# Patient Record
Sex: Male | Born: 1975 | Race: White | Hispanic: No | Marital: Single | Smoking: Former smoker
Health system: Southern US, Community
[De-identification: ages and names within clinical notes are randomized; demographics above are authoritative.]

## PROBLEM LIST (undated history)

## (undated) DIAGNOSIS — N411 Chronic prostatitis: Secondary | ICD-10-CM

## (undated) DIAGNOSIS — R202 Paresthesia of skin: Secondary | ICD-10-CM

## (undated) DIAGNOSIS — R200 Anesthesia of skin: Secondary | ICD-10-CM

## (undated) DIAGNOSIS — F419 Anxiety disorder, unspecified: Secondary | ICD-10-CM

## (undated) DIAGNOSIS — I10 Essential (primary) hypertension: Secondary | ICD-10-CM

## (undated) HISTORY — DX: Anxiety disorder, unspecified: F41.9

## (undated) HISTORY — DX: Paresthesia of skin: R20.0

## (undated) HISTORY — DX: Paresthesia of skin: R20.2

## (undated) HISTORY — PX: WISDOM TOOTH EXTRACTION: SHX21

## (undated) HISTORY — DX: Essential (primary) hypertension: I10

## (undated) HISTORY — DX: Chronic prostatitis: N41.1

---

## 2014-10-29 ENCOUNTER — Encounter: Payer: Self-pay | Admitting: Neurology

## 2014-10-29 ENCOUNTER — Ambulatory Visit (INDEPENDENT_AMBULATORY_CARE_PROVIDER_SITE_OTHER): Payer: 59 | Admitting: Neurology

## 2014-10-29 ENCOUNTER — Ambulatory Visit: Payer: 59 | Admitting: Neurology

## 2014-10-29 VITALS — BP 107/76 | HR 71 | Ht 68.25 in | Wt 183.0 lb

## 2014-10-29 DIAGNOSIS — R202 Paresthesia of skin: Secondary | ICD-10-CM

## 2014-10-29 DIAGNOSIS — F419 Anxiety disorder, unspecified: Secondary | ICD-10-CM | POA: Diagnosis not present

## 2014-10-29 NOTE — Progress Notes (Signed)
PATIENT: Roger Herring DOB: 30-Sep-1975  Chief Complaint  Patient presents with  . Numbness    He has been having numbness/tingling in his left leg, 2nd, 3rd and 4th toes on his left foot.  Occasionally, he will have these symptoms in his bilateral hands.  Feels these sensations started after having a reaction to Cipro he was taking for chronic prostatits.     HISTORICAL  Roger Herring is a 39 years old right-handed male, seen in refer by his primary care physician Dr. Lanora Manis Barns for evaluation of intermittent bilateral lower extremity and bilateral hands paresthesia     I have reviewed and summarized most recent office note in October 18 2014    He had a past medical history of hypertension, depression anxiety, recurrent chronic prostatitis, Laboratory evaluation in August 2016, normal CBC, CMP, negative HIV, RPR,  Negative hepatitis B, hepatitis C antibody, mild elevated cholesterol 221, LDL 148  Since July 2016, he had recurrent prostatitis, was treated with few rounds of antibiotics, Bactrim, Cipro, doxycycline  Around and of July 2016, he noticed bilateral lower extremity paresthesia, from knee down, burning stinging sensation, involving both legs symmetrically, over the past few weeks, his lower extremity paresthesia has gradually improved,  He was diagnosed with left foot tendinitis, wearing left ankle brace for a while , his bilateral lower extremity paresthesia almost disappeared, with exception of persistent left second toe to 4th toe numbness, he denies gait difficulty, he denies weakness, he denies bowel and bladder incontinence, he denies low back pain He also complains of intermittent fingertips paresthesia, no persistent sensory loss   REVIEW OF SYSTEMS: Full 14 system review of systems performed and notable only for  Weight loss, fatigue, snoring, urination problem, flushing, achy muscles, allergy, skin sensitivity, numbness, insomnia, snoring, depression, anxiety,  not enough sleep, change in appetite, disinteresting activities, racing thoughts  ALLERGIES: Allergies  Allergen Reactions  . Fluocinolone Dermatitis  . Sulfamethoxazole-Trimethoprim Dermatitis    HOME MEDICATIONS: Current Outpatient Prescriptions  Medication Sig Dispense Refill  . ALPRAZolam (XANAX) 0.25 MG tablet TAKE 1 TABLET BY MOUTH TWICE A DAY AS NEEDED FOR ANXIETY  0  . cefUROXime (CEFTIN) 500 MG tablet Take 500 mg by mouth 2 (two) times daily.  0  . lisinopril (PRINIVIL,ZESTRIL) 10 MG tablet Take 10 mg by mouth daily.  0  . Probiotic Product (SOLUBLE FIBER/PROBIOTICS PO) Take by mouth.    . saw palmetto 160 MG capsule Take 160 mg by mouth 2 (two) times daily.     No current facility-administered medications for this visit.    PAST MEDICAL HISTORY: Past Medical History  Diagnosis Date  . Chronic prostatitis   . Hypertension   . Anxiety   . Numbness and tingling     PAST SURGICAL HISTORY: Past Surgical History  Procedure Laterality Date  . Wisdom tooth extraction      FAMILY HISTORY: Family History  Problem Relation Age of Onset  . Hypertension Father   . Hypertension Mother   . Anxiety disorder Father   . Breast cancer Maternal Grandmother   . Prostatitis Maternal Grandfather     SOCIAL HISTORY:  Social History   Social History  . Marital Status: Single    Spouse Name: N/A  . Number of Children: 0  . Years of Education: 16   Occupational History  . [employer] sales    Social History Main Topics  . Smoking status: Former Games developer  . Smokeless tobacco: Not on file  Comment: Quit 2014 - only smoked socially  . Alcohol Use: No     Comment: None used since June 2016.  . Drug Use: No  . Sexual Activity: Not on file   Other Topics Concern  . Not on file   Social History Narrative   Lives at home with his partner.   Right-handed.   No caffeine use.     PHYSICAL EXAM   Filed Vitals:   10/29/14 1346  BP: 107/76  Pulse: 71  Height:  5' 8.25" (1.734 m)  Weight: 183 lb (83.008 kg)    Not recorded      Body mass index is 27.61 kg/(m^2).  PHYSICAL EXAMNIATION:  Gen: NAD, conversant, well nourised, obese, well groomed                     Cardiovascular: Regular rate rhythm, no peripheral edema, warm, nontender. Eyes: Conjunctivae clear without exudates or hemorrhage Neck: Supple, no carotid bruise. Pulmonary: Clear to auscultation bilaterally   NEUROLOGICAL EXAM:  MENTAL STATUS: Speech:    Speech is normal; fluent and spontaneous with normal comprehension.  Cognition:     Orientation to time, place and person     Normal recent and remote memory     Normal Attention span and concentration     Normal Language, naming, repeating,spontaneous speech     Fund of knowledge   CRANIAL NERVES: CN II: Visual fields are full to confrontation. Fundoscopic exam is normal with sharp discs and no vascular changes. Pupils are round equal and briskly reactive to light. CN III, IV, VI: extraocular movement are normal. No ptosis. CN V: Facial sensation is intact to pinprick in all 3 divisions bilaterally. Corneal responses are intact.  CN VII: Face is symmetric with normal eye closure and smile. CN VIII: Hearing is normal to rubbing fingers CN IX, X: Palate elevates symmetrically. Phonation is normal. CN XI: Head turning and shoulder shrug are intact CN XII: Tongue is midline with normal movements and no atrophy.  MOTOR: There is no pronator drift of out-stretched arms. Muscle bulk and tone are normal. Muscle strength is normal.  REFLEXES: Reflexes are 2+ and symmetric at the biceps, triceps, knees, and ankles. Plantar responses are flexor.  SENSORY: Intact to light touch, pinprick, position sense, and vibration sense are intact in fingers and toes.  COORDINATION: Rapid alternating movements and fine finger movements are intact. There is no dysmetria on finger-to-nose and heel-knee-shin.    GAIT/STANCE: Posture is  normal. Gait is steady with normal steps, base, arm swing, and turning. Heel and toe walking are normal. Tandem gait is normal.  Romberg is absent.   DIAGNOSTIC DATA (LABS, IMAGING, TESTING) - I reviewed patient records, labs, notes, testing and imaging myself where available.   ASSESSMENT AND PLAN  Addiel Mccardle is a 39 y.o. male   Paresthesia   differentiation diagnosis including  transient compression neuropathy, involves left common peroneal nerve, bilateral carpal tunnel syndromes.   His symptoms overall has much improved    After discuss with patient, we decided to continue observe his symptoms   Return to clinic for new issues   Levert Feinstein, M.D. Ph.D.  [facility] [address] [place], [place] [address] Ph: [phone] Fax: [phone]  CC: Dr Juluis Rainier

## 2017-11-16 DIAGNOSIS — E7800 Pure hypercholesterolemia, unspecified: Secondary | ICD-10-CM | POA: Diagnosis not present

## 2017-11-16 DIAGNOSIS — I10 Essential (primary) hypertension: Secondary | ICD-10-CM | POA: Diagnosis not present

## 2017-11-16 DIAGNOSIS — Z0000 Encounter for general adult medical examination without abnormal findings: Secondary | ICD-10-CM | POA: Diagnosis not present

## 2017-11-16 DIAGNOSIS — J302 Other seasonal allergic rhinitis: Secondary | ICD-10-CM | POA: Diagnosis not present

## 2017-12-13 DIAGNOSIS — R197 Diarrhea, unspecified: Secondary | ICD-10-CM | POA: Diagnosis not present

## 2018-03-27 ENCOUNTER — Emergency Department (HOSPITAL_COMMUNITY): Payer: BLUE CROSS/BLUE SHIELD

## 2018-03-27 ENCOUNTER — Emergency Department (HOSPITAL_COMMUNITY)
Admission: EM | Admit: 2018-03-27 | Discharge: 2018-03-27 | Disposition: A | Discharge status: Home / Self Care | Payer: BLUE CROSS/BLUE SHIELD | Attending: Emergency Medicine | Admitting: Emergency Medicine

## 2018-03-27 ENCOUNTER — Encounter (HOSPITAL_COMMUNITY): Payer: Self-pay | Admitting: Emergency Medicine

## 2018-03-27 DIAGNOSIS — R0789 Other chest pain: Secondary | ICD-10-CM

## 2018-03-27 DIAGNOSIS — Z79899 Other long term (current) drug therapy: Secondary | ICD-10-CM | POA: Diagnosis not present

## 2018-03-27 DIAGNOSIS — Z87891 Personal history of nicotine dependence: Secondary | ICD-10-CM | POA: Insufficient documentation

## 2018-03-27 DIAGNOSIS — I10 Essential (primary) hypertension: Secondary | ICD-10-CM | POA: Diagnosis not present

## 2018-03-27 DIAGNOSIS — R0602 Shortness of breath: Secondary | ICD-10-CM | POA: Diagnosis not present

## 2018-03-27 DIAGNOSIS — R079 Chest pain, unspecified: Secondary | ICD-10-CM | POA: Diagnosis not present

## 2018-03-27 LAB — CBC WITH DIFFERENTIAL/PLATELET
Abs Immature Granulocytes: 0.02 10*3/uL (ref 0.00–0.07)
Basophils Absolute: 0 10*3/uL (ref 0.0–0.1)
Basophils Relative: 1 %
EOS PCT: 2 %
Eosinophils Absolute: 0.2 10*3/uL (ref 0.0–0.5)
HCT: 50.1 % (ref 39.0–52.0)
Hemoglobin: 16.1 g/dL (ref 13.0–17.0)
Immature Granulocytes: 0 %
Lymphocytes Relative: 33 %
Lymphs Abs: 2.2 10*3/uL (ref 0.7–4.0)
MCH: 28.6 pg (ref 26.0–34.0)
MCHC: 32.1 g/dL (ref 30.0–36.0)
MCV: 89 fL (ref 80.0–100.0)
MONO ABS: 0.5 10*3/uL (ref 0.1–1.0)
Monocytes Relative: 7 %
Neutro Abs: 3.8 10*3/uL (ref 1.7–7.7)
Neutrophils Relative %: 57 %
Platelets: 220 10*3/uL (ref 150–400)
RBC: 5.63 MIL/uL (ref 4.22–5.81)
RDW: 13.1 % (ref 11.5–15.5)
WBC: 6.7 10*3/uL (ref 4.0–10.5)
nRBC: 0 % (ref 0.0–0.2)

## 2018-03-27 LAB — COMPREHENSIVE METABOLIC PANEL
ALT: 43 U/L (ref 0–44)
AST: 29 U/L (ref 15–41)
Albumin: 4.3 g/dL (ref 3.5–5.0)
Alkaline Phosphatase: 47 U/L (ref 38–126)
Anion gap: 10 (ref 5–15)
BUN: 11 mg/dL (ref 6–20)
CHLORIDE: 103 mmol/L (ref 98–111)
CO2: 25 mmol/L (ref 22–32)
Calcium: 9.6 mg/dL (ref 8.9–10.3)
Creatinine, Ser: 1.14 mg/dL (ref 0.61–1.24)
GFR calc Af Amer: >60 mL/min (ref >60)
GFR calc non Af Amer: >60 mL/min (ref >60)
Glucose, Bld: 109 mg/dL — ABNORMAL HIGH (ref 70–99)
Potassium: 3.9 mmol/L (ref 3.5–5.1)
Sodium: 138 mmol/L (ref 135–145)
Total Bilirubin: 1.3 mg/dL — ABNORMAL HIGH (ref 0.3–1.2)
Total Protein: 7.6 g/dL (ref 6.5–8.1)

## 2018-03-27 LAB — I-STAT TROPONIN, ED: Troponin i, poc: 0 ng/mL (ref 0.00–0.08)

## 2018-03-27 LAB — LIPASE, BLOOD: Lipase: 31 U/L (ref 11–51)

## 2018-03-27 MED ORDER — HYDROXYZINE HCL 25 MG PO TABS: 25.0000 mg | ORAL_TABLET | Freq: Four times a day (QID) | ORAL | 0 refills | Status: AC

## 2018-03-27 MED ORDER — ALPRAZOLAM 0.25 MG PO TABS
0.2500 mg | ORAL_TABLET | Freq: Once | ORAL | Status: AC
  Administered 2018-03-27: 0.25 mg via ORAL
  Filled 2018-03-27: qty 1

## 2018-03-27 NOTE — ED Provider Notes (Signed)
[facility] Note   CSN: [id number] Arrival date & time: 03/27/18  1725     History   Chief Complaint Chief Complaint  Patient presents with  . Chest Pain    HPI Roger Herring is a 43 y.o. male with a past medical history of hypertension, anxiety, who presents today for evaluation of chest tightness.  He reports that at around 4 AM he awoke with approximately 1 hour of chest pain.  He says that that went away.  He says that he ate dinner and the chest pain came back.  He took some Pepto-Bismol and felt better however continues to have 3 out of 10 tightness in his chest.  He denies nausea vomiting or diarrhea.  He denies any shortness of breath.  He notes that he is very anxious.  He denies diaphoresis.  He denies any recent surgeries, immobilizations, leg swelling, hemoptysis, or hormone use.    At approximately 5 PM he took a second 10 mg of lisinopril as he was concerned that his pain may be due to his blood pressure being elevated.    HPI  Past Medical History:  Diagnosis Date  . Anxiety   . Chronic prostatitis   . Hypertension   . Numbness and tingling     There are no active problems to display for this patient.   Past Surgical History:  Procedure Laterality Date  . WISDOM TOOTH EXTRACTION          Home Medications    Prior to Admission medications   Medication Sig Start Date End Date Taking? Authorizing Provider  ALPRAZolam (XANAX) 0.25 MG tablet TAKE 1 TABLET BY MOUTH TWICE A DAY AS NEEDED FOR ANXIETY 10/18/14   [provider]  cefUROXime (CEFTIN) 500 MG tablet Take 500 mg by mouth 2 (two) times daily. 10/15/14   [provider]  hydrOXYzine (ATARAX/VISTARIL) 25 MG tablet Take 1 tablet (25 mg total) by mouth every 6 (six) hours. 03/27/18   Roger Herring,  W, PA-C  lisinopril (PRINIVIL,ZESTRIL) 10 MG tablet Take 10 mg by mouth daily. 10/18/14   [provider]  Probiotic Product (SOLUBLE  FIBER/PROBIOTICS PO) Take by mouth.    [provider]  saw palmetto 160 MG capsule Take 160 mg by mouth 2 (two) times daily.    [provider]    Family History Family History  Problem Relation Age of Onset  . Hypertension Mother   . Hypertension Father   . Anxiety disorder Father   . Breast cancer Maternal Grandmother   . Prostatitis Maternal Grandfather     Social History Social History   Tobacco Use  . Smoking status: Former Games developermoker  . Smokeless tobacco: Never Used  . Tobacco comment: Quit 2014 - only smoked socially  Substance Use Topics  . Alcohol use: No    Alcohol/week: 0.0 standard drinks    Comment: None used since June 2016.  . Drug use: No     Allergies   Fluocinolone and Sulfamethoxazole-trimethoprim   Review of Systems Review of Systems  Constitutional: Negative for chills and fever.  HENT: Negative for congestion.   Eyes: Negative for visual disturbance.  Respiratory: Positive for chest tightness. Negative for cough and shortness of breath.   Cardiovascular: Positive for chest pain. Negative for palpitations and leg swelling.  Gastrointestinal: Negative for abdominal pain, diarrhea, nausea and vomiting.  Genitourinary: Negative for dysuria.  Musculoskeletal: Negative for back pain and neck pain.  Neurological: Negative for weakness,  numbness and headaches.  All other systems reviewed and are negative.    Physical Exam Updated Vital Signs BP (!) 141/87 (BP Location: Right Arm)   Pulse 91   Temp 98.3 F (36.8 C) (Oral)   Resp 16   SpO2 99%   Physical Exam Vitals signs and nursing note reviewed.  Constitutional:      General: He is not in acute distress.    Appearance: He is well-developed.  HENT:     Head: Normocephalic and atraumatic.  Eyes:     Conjunctiva/sclera: Conjunctivae normal.  Neck:     Musculoskeletal: Normal range of motion and neck supple.     Vascular: No JVD.     Trachea: No tracheal deviation.    Cardiovascular:     Rate and Rhythm: Normal rate and regular rhythm.     Pulses: Normal pulses.     Heart sounds: Normal heart sounds. No murmur.     Comments: Mild TTP over anterior chest, primarily right sided.  Unable to recreate his pain with palpation.  Pulmonary:     Effort: Pulmonary effort is normal. No tachypnea, accessory muscle usage or respiratory distress.     Breath sounds: Normal breath sounds. No stridor. No decreased breath sounds, wheezing or rhonchi.  Chest:     Chest wall: Tenderness present.  Abdominal:     General: Bowel sounds are normal.     Palpations: Abdomen is soft.     Tenderness: There is no abdominal tenderness. There is no guarding or rebound.  Lymphadenopathy:     Cervical: No cervical adenopathy.  Skin:    General: Skin is warm and dry.  Neurological:     General: No focal deficit present.     Mental Status: He is alert. Mental status is at baseline.  Psychiatric:        Mood and Affect: Mood normal.        Behavior: Behavior normal.      ED Treatments / Results  Labs (all labs ordered are listed, but only abnormal results are displayed) Labs Reviewed  COMPREHENSIVE METABOLIC PANEL - Abnormal; Notable for the following components:      Result Value   Glucose, Bld 109 (*)    Total Bilirubin 1.3 (*)    All other components within normal limits  CBC WITH DIFFERENTIAL/PLATELET  LIPASE, BLOOD  I-STAT TROPONIN, ED    EKG EKG Interpretation  Date/Time:  Sunday March 27 2018 17:31:59 EST Ventricular Rate:  112 PR Interval:    QRS Duration: 95 QT Interval:  318 QTC Calculation: 434 R Axis:   25 Text Interpretation:  Sinus tachycardia Borderline T abnormalities, anterior leads no acute ischemic appearance, no old comparison Confirmed by Arby BarrettePfeiffer, Marcy [phone]([id number]) on 03/27/2018 5:38:27 PM   EKG Interpretation  Date/Time:  Sunday March 27 2018 20:19:45 EST Ventricular Rate:  91 PR Interval:    QRS Duration: 108 QT Interval:  350 QTC  Calculation: 431 R Axis:   34 Text Interpretation:  Sinus rhythm Low voltage, precordial leads Borderline T abnormalities, anterior leads no interval change from previous Confirmed by Arby BarrettePfeiffer, Marcy [phone]([id number]) on 03/27/2018 8:24:07 PM        Radiology Dg Chest 2 View  Result Date: 03/27/2018 CLINICAL DATA:  Centralized chest pain.  Shortness of breath. EXAM: CHEST - 2 VIEW COMPARISON:  None. FINDINGS: The heart size and mediastinal contours are within normal limits. Both lungs are clear. The visualized skeletal structures are unremarkable. IMPRESSION: No active cardiopulmonary disease. Electronically Signed  By: Gerome Sam III M.D   On: 03/27/2018 19:14    Procedures Procedures (including critical care time)  Medications Ordered in ED Medications  ALPRAZolam Prudy Feeler) tablet 0.25 mg (0.25 mg Oral Given 03/27/18 1948)     Initial Impression / Assessment and Plan / ED Course  I have reviewed the triage vital signs and the nursing notes.  Pertinent labs & imaging results that were available during my care of the patient were reviewed by me and considered in my medical decision making (see chart for details).  Clinical Course as of Mar 27 2148  Wynelle Link Mar 27, 2018  1952 Patient reevaluated.  He reports feeling generally better however he is still anxious.  Ordered home dose of Xanax.  He does not want to stay for 3-hour repeat troponin, current plan is approximately 45 minutes after p.o. Xanax will repeat EKG, as long as there are no changes will anticipate discharge home.   [EH]    Clinical Course User Index [EH] Roger Gong, PA-C   Patient presents today for evaluation of 2 episodes of chest pain.  His most recent 1 started after he was eating today.  Labs were obtained and reviewed, troponin is not elevated, he does not have any significant electrolyte or hematologic derangements.  Lipase is not elevated.  EKG reviewed with nonspecific anterior borderline T wave changes.  He  was observed in the emergency room for over an hour and EKG was repeated showing no changes.  He did report feeling significantly anxious and was treated with Xanax as he has taken this at home in the past and it has helped.  His pain went away while he was in the emergency room.  I discussed with him a second troponin, which he declined.  Will he has intermittently been tachycardic in the emergency room, when he is distracted his heart rate normalizes, this is more consistent with anxiety than PE, and he is otherwise PERC negative.  Heart score is 2 if count borderline T wave abnormalities as repolarization changes.    With his anxiety will give prescription for hydroxyzine to use at home if needed.  He did take a second dose of lisinopril prior to arrival, however his blood pressure has consistently been in the 140s systolic.  Advised to follow-up with primary care doctor regarding today's ED visit.  Return precautions were discussed with patient who states their understanding.  At the time of discharge patient denied any unaddressed complaints or concerns.  Patient is agreeable for discharge home.   Final Clinical Impressions(s) / ED Diagnoses   Final diagnoses:  Atypical chest pain  Hypertension, unspecified type    ED Discharge Orders         Ordered    hydrOXYzine (ATARAX/VISTARIL) 25 MG tablet  Every 6 hours     03/27/18 1944           Norman Clay 03/27/18 2150    Arby Barrette, MD 03/28/18 [phone]

## 2018-03-27 NOTE — ED Triage Notes (Signed)
Patient reports chest tightness that started overnight. He normally takes Lisinopril 10mg , but he took another 10mg  because he thought it might be his blood pressure. He reports that the chest tightness came back after he ate, he took some Pepto and felt better but continues to feel tightness in the center of his chest.

## 2018-03-27 NOTE — Discharge Instructions (Addendum)
Today you were given a dose of Xanax while in the department to help with your anxiety.  You have been given one prescription, hydroxyzine also known as Atarax or Vistaril which you can take as needed for anxiety.  I have given you 3 handouts to read.  Please make sure you read through them.  Mindfulness based stress reduction, nonspecific chest pain, and hypertension.  While you are in the emergency room your blood pressure was elevated, please schedule a follow-up appointment with your doctor in the next week, or if you have any new concerning or worsening symptoms please seek additional medical care and evaluation.  Today you received medications that may make you sleepy or impair your ability to make decisions.  For the next 24 hours please do not drive, operate heavy machinery, care for a small child with out another adult present, or perform any activities that may cause harm to you or someone else if you were to fall asleep or be impaired.   You are being prescribed a medication which may make you sleepy.  Please use caution doing any of the above tasks while taking it, and I recommend that you do not drive or perform other dangerous activities until you know how this medicine will affect you.

## 2018-04-12 DIAGNOSIS — B349 Viral infection, unspecified: Secondary | ICD-10-CM | POA: Diagnosis not present

## 2018-04-12 DIAGNOSIS — I10 Essential (primary) hypertension: Secondary | ICD-10-CM | POA: Diagnosis not present

## 2018-11-29 DIAGNOSIS — E7800 Pure hypercholesterolemia, unspecified: Secondary | ICD-10-CM | POA: Diagnosis not present

## 2018-11-29 DIAGNOSIS — I10 Essential (primary) hypertension: Secondary | ICD-10-CM | POA: Diagnosis not present

## 2018-11-29 DIAGNOSIS — J302 Other seasonal allergic rhinitis: Secondary | ICD-10-CM | POA: Diagnosis not present

## 2018-12-01 DIAGNOSIS — Z23 Encounter for immunization: Secondary | ICD-10-CM | POA: Diagnosis not present

## 2018-12-01 DIAGNOSIS — Z6830 Body mass index (BMI) 30.0-30.9, adult: Secondary | ICD-10-CM | POA: Diagnosis not present

## 2018-12-01 DIAGNOSIS — I10 Essential (primary) hypertension: Secondary | ICD-10-CM | POA: Diagnosis not present

## 2018-12-01 DIAGNOSIS — E7800 Pure hypercholesterolemia, unspecified: Secondary | ICD-10-CM | POA: Diagnosis not present

## 2019-04-20 ENCOUNTER — Ambulatory Visit: Payer: Managed Care, Other (non HMO) | Attending: Internal Medicine

## 2019-04-20 DIAGNOSIS — Z23 Encounter for immunization: Secondary | ICD-10-CM | POA: Insufficient documentation

## 2019-04-20 NOTE — Progress Notes (Signed)
   Covid-19 Vaccination Clinic  Name:  Roger Herring    MRN: [id number] DOB: [birth year]  04/20/2019  [name] was observed post Covid-19 immunization for 15 minutes without incidence. He was provided with Vaccine Information Sheet and instruction to access the V-Safe system.   [name] was instructed to call 911 with any severe reactions post vaccine: Marland Kitchen Difficulty breathing  . Swelling of your face and throat  . A fast heartbeat  . A bad rash all over your body  . Dizziness and weakness    Immunizations Administered    Name Date Dose VIS Date Route   Pfizer COVID-19 Vaccine 04/20/2019 10:57 AM 0.3 mL 02/17/2019 Intramuscular   Manufacturer: [employer], [employer]   Lot: [id number]   NDC: [address]-8

## 2019-05-13 ENCOUNTER — Ambulatory Visit: Payer: Managed Care, Other (non HMO) | Attending: Internal Medicine

## 2019-05-13 DIAGNOSIS — Z23 Encounter for immunization: Secondary | ICD-10-CM | POA: Insufficient documentation

## 2019-05-13 NOTE — Progress Notes (Signed)
   Covid-19 Vaccination Clinic  Name:  Clayton Bosserman    MRN: [id number] DOB: [birth year]  05/13/2019  Mr. Suppes was observed post Covid-19 immunization for 15 minutes without incident. He was provided with Vaccine Information Sheet and instruction to access the V-Safe system.   Mr. Axel was instructed to call 911 with any severe reactions post vaccine: Marland Kitchen Difficulty breathing  . Swelling of face and throat  . A fast heartbeat  . A bad rash all over body  . Dizziness and weakness   Immunizations Administered    Name Date Dose VIS Date Route   Pfizer COVID-19 Vaccine 05/13/2019 11:00 AM 0.3 mL 02/17/2019 Intramuscular   Manufacturer: [employer], [employer]   Lot: [id number]   NDC: [address]-1

## 2020-02-19 IMAGING — CR DG CHEST 2V
2 series · 2 of 2 positions shown · non-contrast
Comparison: None.

CLINICAL DATA: Centralized chest pain.  Shortness of breath.

EXAM:
CHEST - 2 VIEW

[chest pa]
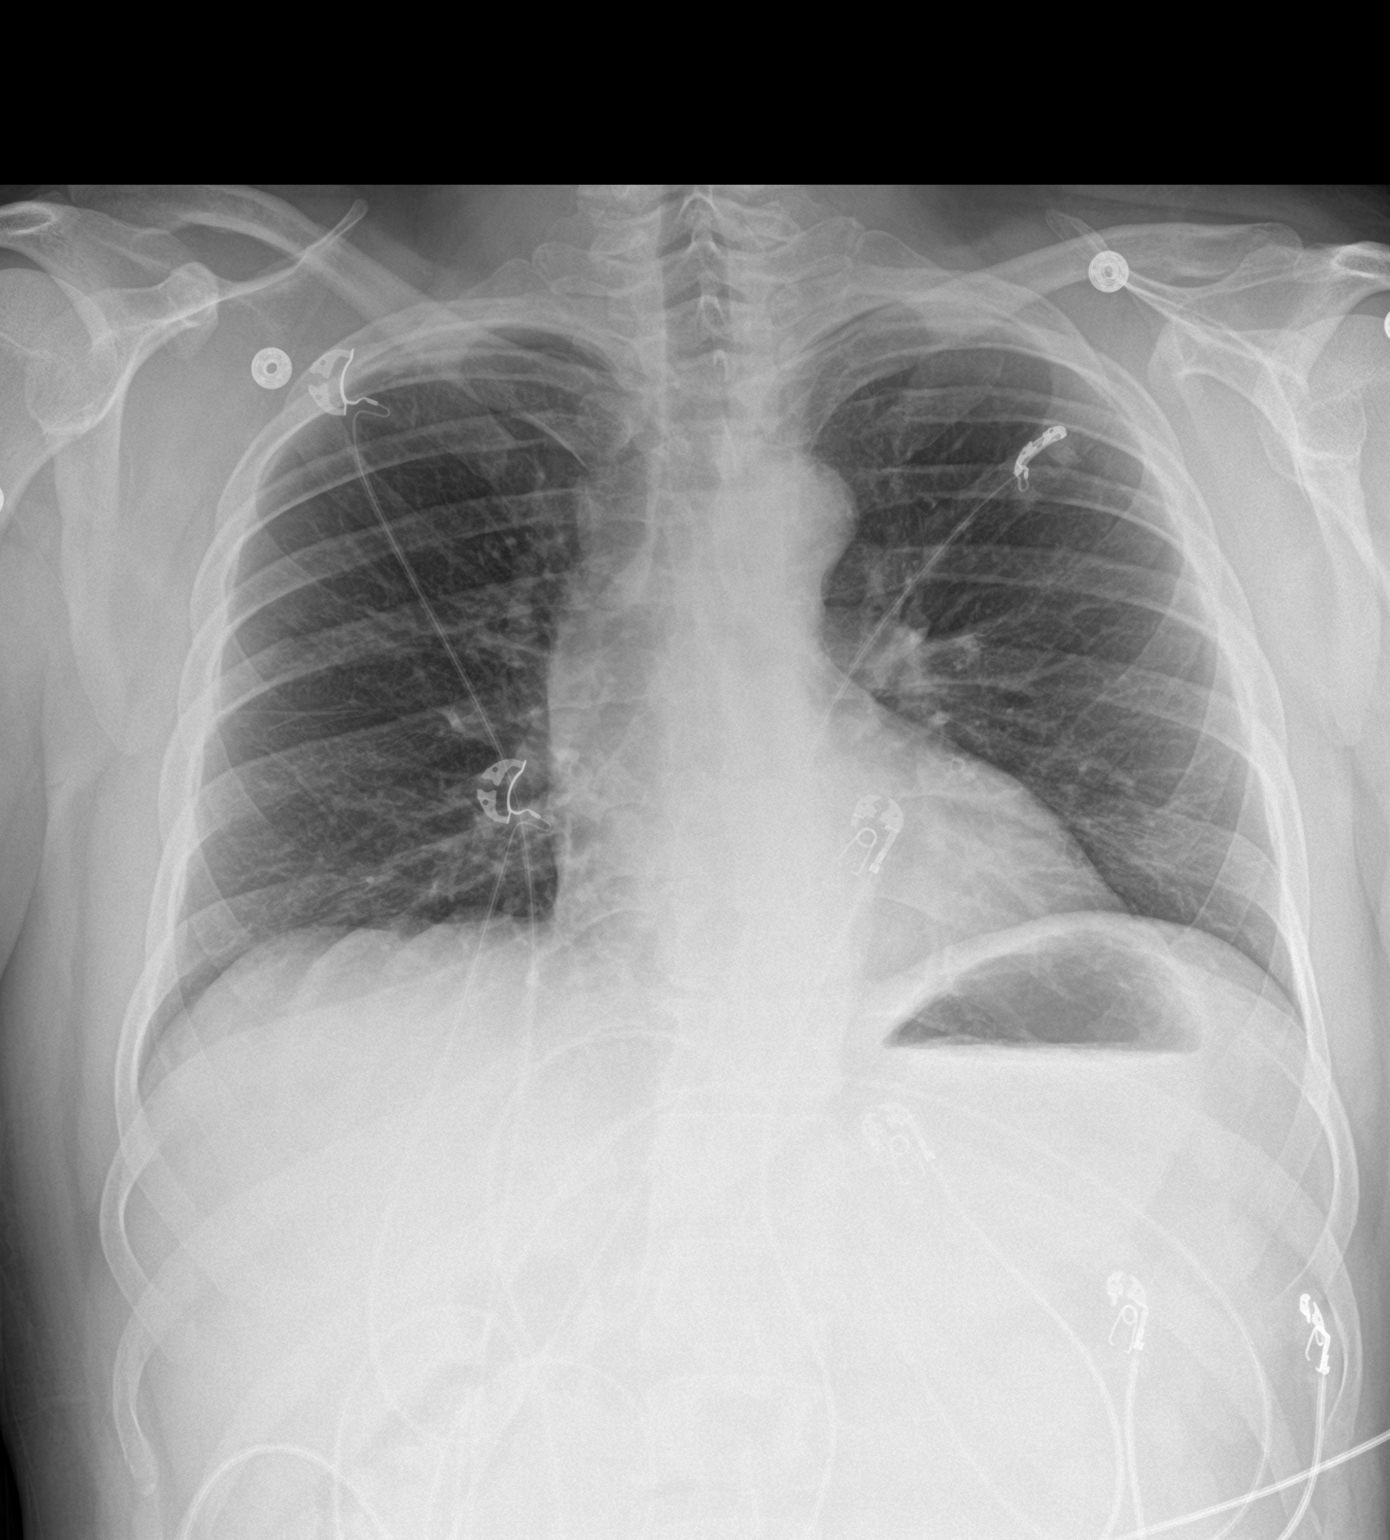

[chest lat]
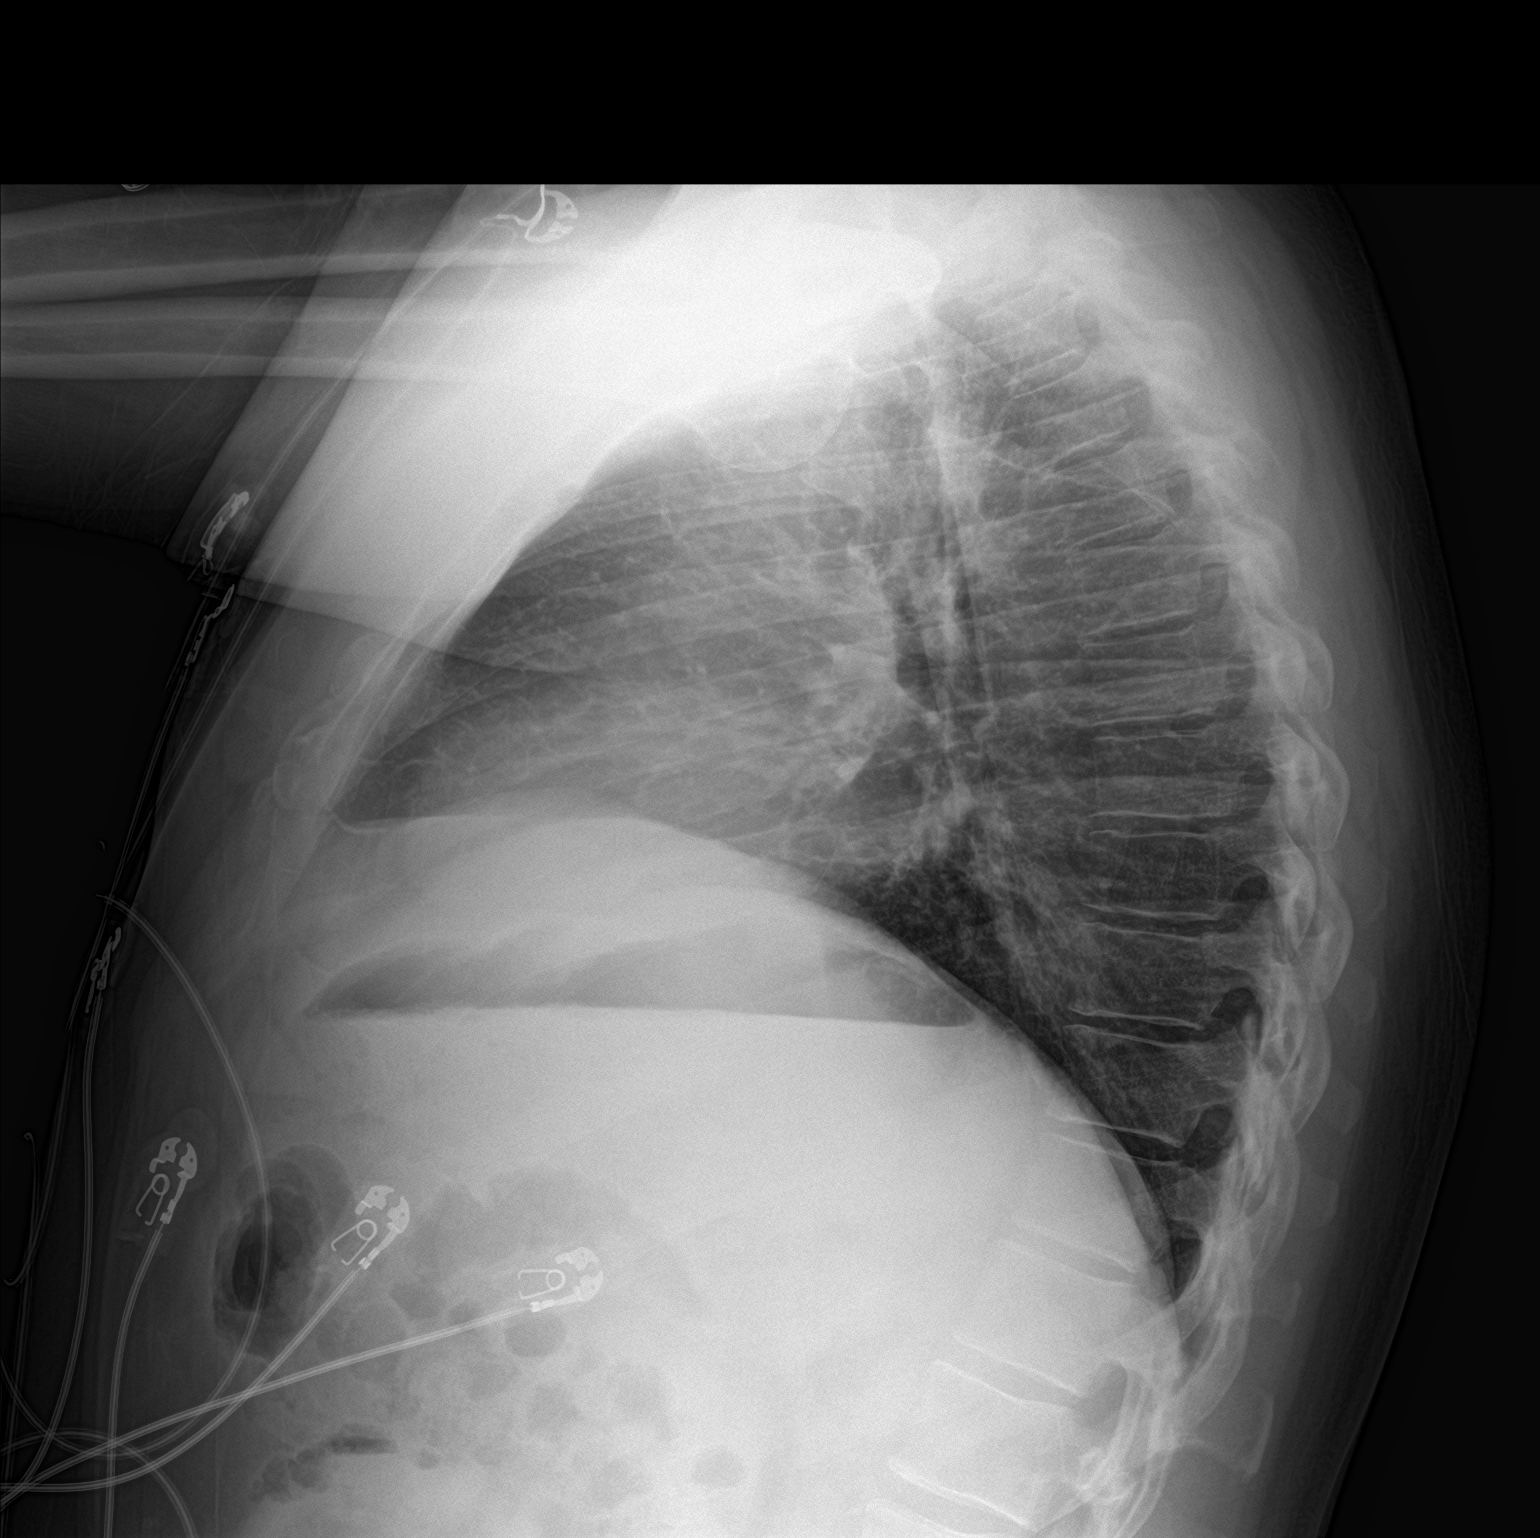

[2 of 2 positions shown; findings below may reference images not displayed]

FINDINGS: The heart size and mediastinal contours are within normal limits.
Both lungs are clear. The visualized skeletal structures are
unremarkable.
IMPRESSION: No active cardiopulmonary disease.

## 2021-03-10 DIAGNOSIS — Z20822 Contact with and (suspected) exposure to covid-19: Secondary | ICD-10-CM | POA: Diagnosis not present

## 2021-03-21 DIAGNOSIS — Z23 Encounter for immunization: Secondary | ICD-10-CM | POA: Diagnosis not present

## 2021-03-21 DIAGNOSIS — E7800 Pure hypercholesterolemia, unspecified: Secondary | ICD-10-CM | POA: Diagnosis not present

## 2021-03-21 DIAGNOSIS — Z13220 Encounter for screening for lipoid disorders: Secondary | ICD-10-CM | POA: Diagnosis not present

## 2021-03-21 DIAGNOSIS — R1310 Dysphagia, unspecified: Secondary | ICD-10-CM | POA: Diagnosis not present

## 2021-03-21 DIAGNOSIS — I10 Essential (primary) hypertension: Secondary | ICD-10-CM | POA: Diagnosis not present

## 2021-03-21 DIAGNOSIS — Z0000 Encounter for general adult medical examination without abnormal findings: Secondary | ICD-10-CM | POA: Diagnosis not present

## 2021-03-21 DIAGNOSIS — F419 Anxiety disorder, unspecified: Secondary | ICD-10-CM | POA: Diagnosis not present

## 2021-04-22 DIAGNOSIS — K219 Gastro-esophageal reflux disease without esophagitis: Secondary | ICD-10-CM | POA: Diagnosis not present

## 2021-04-22 DIAGNOSIS — R1310 Dysphagia, unspecified: Secondary | ICD-10-CM | POA: Diagnosis not present

## 2021-04-22 DIAGNOSIS — Z1211 Encounter for screening for malignant neoplasm of colon: Secondary | ICD-10-CM | POA: Diagnosis not present

## 2021-08-01 DIAGNOSIS — R1310 Dysphagia, unspecified: Secondary | ICD-10-CM | POA: Diagnosis not present

## 2021-08-01 DIAGNOSIS — K2289 Other specified disease of esophagus: Secondary | ICD-10-CM | POA: Diagnosis not present

## 2021-08-01 DIAGNOSIS — K319 Disease of stomach and duodenum, unspecified: Secondary | ICD-10-CM | POA: Diagnosis not present

## 2021-08-01 DIAGNOSIS — K3189 Other diseases of stomach and duodenum: Secondary | ICD-10-CM | POA: Diagnosis not present

## 2021-08-01 DIAGNOSIS — K648 Other hemorrhoids: Secondary | ICD-10-CM | POA: Diagnosis not present

## 2021-08-01 DIAGNOSIS — Z1211 Encounter for screening for malignant neoplasm of colon: Secondary | ICD-10-CM | POA: Diagnosis not present

## 2021-11-12 DIAGNOSIS — F419 Anxiety disorder, unspecified: Secondary | ICD-10-CM | POA: Diagnosis not present

## 2022-05-13 DIAGNOSIS — Z0000 Encounter for general adult medical examination without abnormal findings: Secondary | ICD-10-CM | POA: Diagnosis not present

## 2022-05-13 DIAGNOSIS — E7800 Pure hypercholesterolemia, unspecified: Secondary | ICD-10-CM | POA: Diagnosis not present

## 2022-05-13 DIAGNOSIS — I10 Essential (primary) hypertension: Secondary | ICD-10-CM | POA: Diagnosis not present

## 2022-05-13 DIAGNOSIS — K219 Gastro-esophageal reflux disease without esophagitis: Secondary | ICD-10-CM | POA: Diagnosis not present

## 2022-06-09 DIAGNOSIS — F419 Anxiety disorder, unspecified: Secondary | ICD-10-CM | POA: Diagnosis not present

## 2022-08-11 DIAGNOSIS — D2371 Other benign neoplasm of skin of right lower limb, including hip: Secondary | ICD-10-CM | POA: Diagnosis not present

## 2022-08-11 DIAGNOSIS — L814 Other melanin hyperpigmentation: Secondary | ICD-10-CM | POA: Diagnosis not present

## 2022-08-11 DIAGNOSIS — L821 Other seborrheic keratosis: Secondary | ICD-10-CM | POA: Diagnosis not present

## 2022-08-11 DIAGNOSIS — D1801 Hemangioma of skin and subcutaneous tissue: Secondary | ICD-10-CM | POA: Diagnosis not present
